# Patient Record
Sex: Male | Born: 1960 | Race: Black or African American | Hispanic: No | Marital: Married | Smoking: Never smoker
Health system: Southern US, Community
[De-identification: ages and names within clinical notes are randomized; demographics above are authoritative.]

## PROBLEM LIST (undated history)

## (undated) DIAGNOSIS — J45909 Unspecified asthma, uncomplicated: Secondary | ICD-10-CM

## (undated) HISTORY — PX: ANKLE SURGERY: SHX546

## (undated) HISTORY — PX: FRACTURE SURGERY: SHX138

---

## 2015-02-20 ENCOUNTER — Encounter (HOSPITAL_COMMUNITY): Payer: Self-pay | Admitting: *Deleted

## 2015-02-20 ENCOUNTER — Emergency Department (HOSPITAL_COMMUNITY): Payer: Self-pay

## 2015-02-20 ENCOUNTER — Emergency Department (HOSPITAL_COMMUNITY)
Admission: EM | Admit: 2015-02-20 | Discharge: 2015-02-20 | Disposition: A | Discharge status: Home / Self Care | Payer: Self-pay | Attending: Emergency Medicine | Admitting: Emergency Medicine

## 2015-02-20 DIAGNOSIS — Z21 Asymptomatic human immunodeficiency virus [HIV] infection status: Secondary | ICD-10-CM | POA: Insufficient documentation

## 2015-02-20 DIAGNOSIS — R0602 Shortness of breath: Secondary | ICD-10-CM

## 2015-02-20 DIAGNOSIS — B349 Viral infection, unspecified: Secondary | ICD-10-CM | POA: Insufficient documentation

## 2015-02-20 DIAGNOSIS — J45909 Unspecified asthma, uncomplicated: Secondary | ICD-10-CM | POA: Insufficient documentation

## 2015-02-20 DIAGNOSIS — Z79899 Other long term (current) drug therapy: Secondary | ICD-10-CM | POA: Insufficient documentation

## 2015-02-20 HISTORY — DX: Unspecified asthma, uncomplicated: J45.909

## 2015-02-20 LAB — CBC WITH DIFFERENTIAL/PLATELET
Basophils Absolute: 0 10*3/uL (ref 0.0–0.1)
Basophils Relative: 0 % (ref 0–1)
Eosinophils Absolute: 0.1 10*3/uL (ref 0.0–0.7)
Eosinophils Relative: 3 % (ref 0–5)
HEMATOCRIT: 39.1 % (ref 39.0–52.0)
Hemoglobin: 12.8 g/dL — ABNORMAL LOW (ref 13.0–17.0)
LYMPHS ABS: 1.4 10*3/uL (ref 0.7–4.0)
Lymphocytes Relative: 32 % (ref 12–46)
MCH: 24 pg — ABNORMAL LOW (ref 26.0–34.0)
MCHC: 32.7 g/dL (ref 30.0–36.0)
MCV: 73.4 fL — ABNORMAL LOW (ref 78.0–100.0)
MONOS PCT: 9 % (ref 3–12)
Monocytes Absolute: 0.4 10*3/uL (ref 0.1–1.0)
NEUTROS ABS: 2.5 10*3/uL (ref 1.7–7.7)
NEUTROS PCT: 56 % (ref 43–77)
Platelets: 237 10*3/uL (ref 150–400)
RBC: 5.33 MIL/uL (ref 4.22–5.81)
RDW: 14.4 % (ref 11.5–15.5)
WBC: 4.4 10*3/uL (ref 4.0–10.5)

## 2015-02-20 LAB — COMPREHENSIVE METABOLIC PANEL
ALBUMIN: 4.1 g/dL (ref 3.5–5.0)
ALT: 24 U/L (ref 17–63)
ANION GAP: 10 (ref 5–15)
AST: 26 U/L (ref 15–41)
Alkaline Phosphatase: 53 U/L (ref 38–126)
BUN: 14 mg/dL (ref 6–20)
CO2: 26 mmol/L (ref 22–32)
CREATININE: 1.27 mg/dL — AB (ref 0.61–1.24)
Calcium: 9.5 mg/dL (ref 8.9–10.3)
Chloride: 100 mmol/L — ABNORMAL LOW (ref 101–111)
GFR calc Af Amer: >60 mL/min (ref >60)
GFR calc non Af Amer: >60 mL/min (ref >60)
GLUCOSE: 102 mg/dL — AB (ref 70–99)
Potassium: 4.3 mmol/L (ref 3.5–5.1)
SODIUM: 136 mmol/L (ref 135–145)
TOTAL PROTEIN: 7.6 g/dL (ref 6.5–8.1)
Total Bilirubin: 3 mg/dL — ABNORMAL HIGH (ref 0.3–1.2)

## 2015-02-20 LAB — URINALYSIS, ROUTINE W REFLEX MICROSCOPIC
Bilirubin Urine: NEGATIVE
GLUCOSE, UA: NEGATIVE mg/dL
HGB URINE DIPSTICK: NEGATIVE
Ketones, ur: NEGATIVE mg/dL
Leukocytes, UA: NEGATIVE
NITRITE: NEGATIVE
PH: 5.5 (ref 5.0–8.0)
Protein, ur: NEGATIVE mg/dL
Specific Gravity, Urine: 1.024 (ref 1.005–1.030)
Urobilinogen, UA: 0.2 mg/dL (ref 0.0–1.0)

## 2015-02-20 MED ORDER — OSELTAMIVIR PHOSPHATE 75 MG PO CAPS: 75.0000 mg | ORAL_CAPSULE | Freq: Two times a day (BID) | ORAL | Status: AC

## 2015-02-20 NOTE — ED Notes (Signed)
Pt is in stable condition upon d/c and is escorted from ED by staff. 

## 2015-02-20 NOTE — ED Notes (Signed)
Pt c/o sinus pressure, chills, body aches, headaches, blurred vision.

## 2015-02-20 NOTE — ED Provider Notes (Signed)
CSN: [id number]     Arrival date & time 02/20/15  4098 History   First MD Initiated Contact with Patient 02/20/15 1106     Chief Complaint  Patient presents with  . Shortness of Breath     (Consider location/radiation/quality/duration/timing/severity/associated sxs/prior Treatment) Patient is a 54 y.o. male presenting with general illness.  Illness Location:  Facial sinus Quality:  Pressure, headache Severity:  Moderate Onset quality:  Gradual Duration:  2 days Timing:  Constant Progression:  Unchanged Chronicity:  New Context:  HIV, compliant, last viral load undetectable Relieved by:  Nothing Worsened by:  Pressure, nose blowing Associated symptoms: myalgias and rhinorrhea   Associated symptoms: no abdominal pain, no cough, no fever, no nausea and no vomiting     Past Medical History  Diagnosis Date  . Asthma    Past Surgical History  Procedure Laterality Date  . Ankle surgery    . Fracture surgery      L femur repair   No family history on file. History  Substance Use Topics  . Smoking status: Never Smoker   . Smokeless tobacco: Not on file  . Alcohol Use: Yes     Comment: occ    Review of Systems  Constitutional: Negative for fever.  HENT: Positive for rhinorrhea.   Respiratory: Negative for cough.   Gastrointestinal: Negative for nausea, vomiting and abdominal pain.  Musculoskeletal: Positive for myalgias.  All other systems reviewed and are negative.     Allergies  Review of patient's allergies indicates no known allergies.  Home Medications   Prior to Admission medications   Medication Sig Start Date End Date Taking? Authorizing Provider  DOXYCYCLINE PO Take 1 Dose by mouth once.   Yes Historical Provider, MD  NORVIR 100 MG TABS tablet Take 1 tablet by mouth daily. 01/25/15  Yes Historical Provider, MD  oseltamivir (TAMIFLU) 75 MG capsule Take 1 capsule (75 mg total) by mouth every 12 (twelve) hours. 02/20/15   Mirian Mo, MD  REYATAZ 300 MG  capsule Take 300 mg by mouth daily. 01/25/15  Yes Historical Provider, MD  TRUVADA 200-300 MG per tablet Take 1 tablet by mouth daily. 01/25/15  Yes Historical Provider, MD   BP 114/70 mmHg  Pulse 80  Temp(Src) 98.1 F (36.7 C) (Oral)  Resp 19  Ht  (1.778 m)  Wt 247 lb 7 oz (112.237 kg)  BMI 35.50 kg/m2  SpO2 93% Physical Exam  Constitutional: He is oriented to person, place, and time. He appears well-developed and well-nourished.  HENT:  Head: Normocephalic and atraumatic.  Eyes: Conjunctivae and EOM are normal.  Neck: Normal range of motion. Neck supple.  Cardiovascular: Normal rate, regular rhythm and normal heart sounds.   Pulmonary/Chest: Effort normal and breath sounds normal. No respiratory distress.  Abdominal: He exhibits no distension. There is no tenderness. There is no rebound and no guarding.  Musculoskeletal: Normal range of motion.  Neurological: He is alert and oriented to person, place, and time.  Skin: Skin is warm and dry.  Vitals reviewed.   ED Course  Procedures (including critical care time) Labs Review Labs Reviewed  CBC WITH DIFFERENTIAL/PLATELET - Abnormal; Notable for the following:    Hemoglobin 12.8 (*)    MCV 73.4 (*)    MCH 24.0 (*)    All other components within normal limits  COMPREHENSIVE METABOLIC PANEL - Abnormal; Notable for the following:    Chloride 100 (*)    Glucose, Bld 102 (*)    Creatinine, Ser  1.27 (*)    Total Bilirubin 3.0 (*)    All other components within normal limits  URINALYSIS, ROUTINE W REFLEX MICROSCOPIC - Abnormal; Notable for the following:    Color, Urine AMBER (*)    APPearance CLOUDY (*)    All other components within normal limits    Imaging Review Dg Chest 2 View  02/20/2015   CLINICAL DATA:  Four days of shortness of breath with onset of productive cough today, history of asthma, nonsmoker.  EXAM: CHEST  2 VIEW  COMPARISON:  None.  FINDINGS: The lungs are adequately inflated. The interstitial markings are  mildly prominent bilaterally. There is no alveolar infiltrate. There is no pleural effusion. The heart and pulmonary vascularity are normal. The trachea is midline. The bony thorax exhibits no acute abnormality.  IMPRESSION: Acute bronchitic changes.  There is no alveolar pneumonia.   Electronically Signed   By: David  Swazil[place]Jordan M.D.   On: 02/20/2015 11:36     EKG Interpretation   Date/Time:  Monday Feb 20 2015 09:34:40 EDT Ventricular Rate:  93 PR Interval:  184 QRS Duration: 108 QT Interval:  354 QTC Calculation: 440 R Axis:   51 Text Interpretation:  Normal sinus rhythm Normal ECG No old tracing to  compare Confirmed by Mirian MoGentry, Matthew [id number] on 02/20/2015 11:10:22 AM      MDM   Final diagnoses:  Viral syndrome    54 y.o. male with pertinent PMH of HIV, compliant presents with likely viral syndrome.  Pt had sick contact at work with similar symptoms.  He has both signs of a URI and myalgias, making influenza possible.  Wu today unremarkable, ANC normal.  Given prescription for tamiflu, will fu.    I have reviewed all laboratory and imaging studies if ordered as above  1. Viral syndrome   2. SOB (shortness of breath)         Mirian MoMatthew Gentry, MD 02/21/15 0800

## 2015-02-20 NOTE — Discharge Instructions (Signed)

## 2015-11-20 IMAGING — CR DG CHEST 2V
2 series · 2 of 2 positions shown · non-contrast
Comparison: None.

CLINICAL DATA: Four days of shortness of breath with onset of
productive cough today, history of asthma, nonsmoker.

EXAM:
CHEST  2 VIEW

[chest pa]
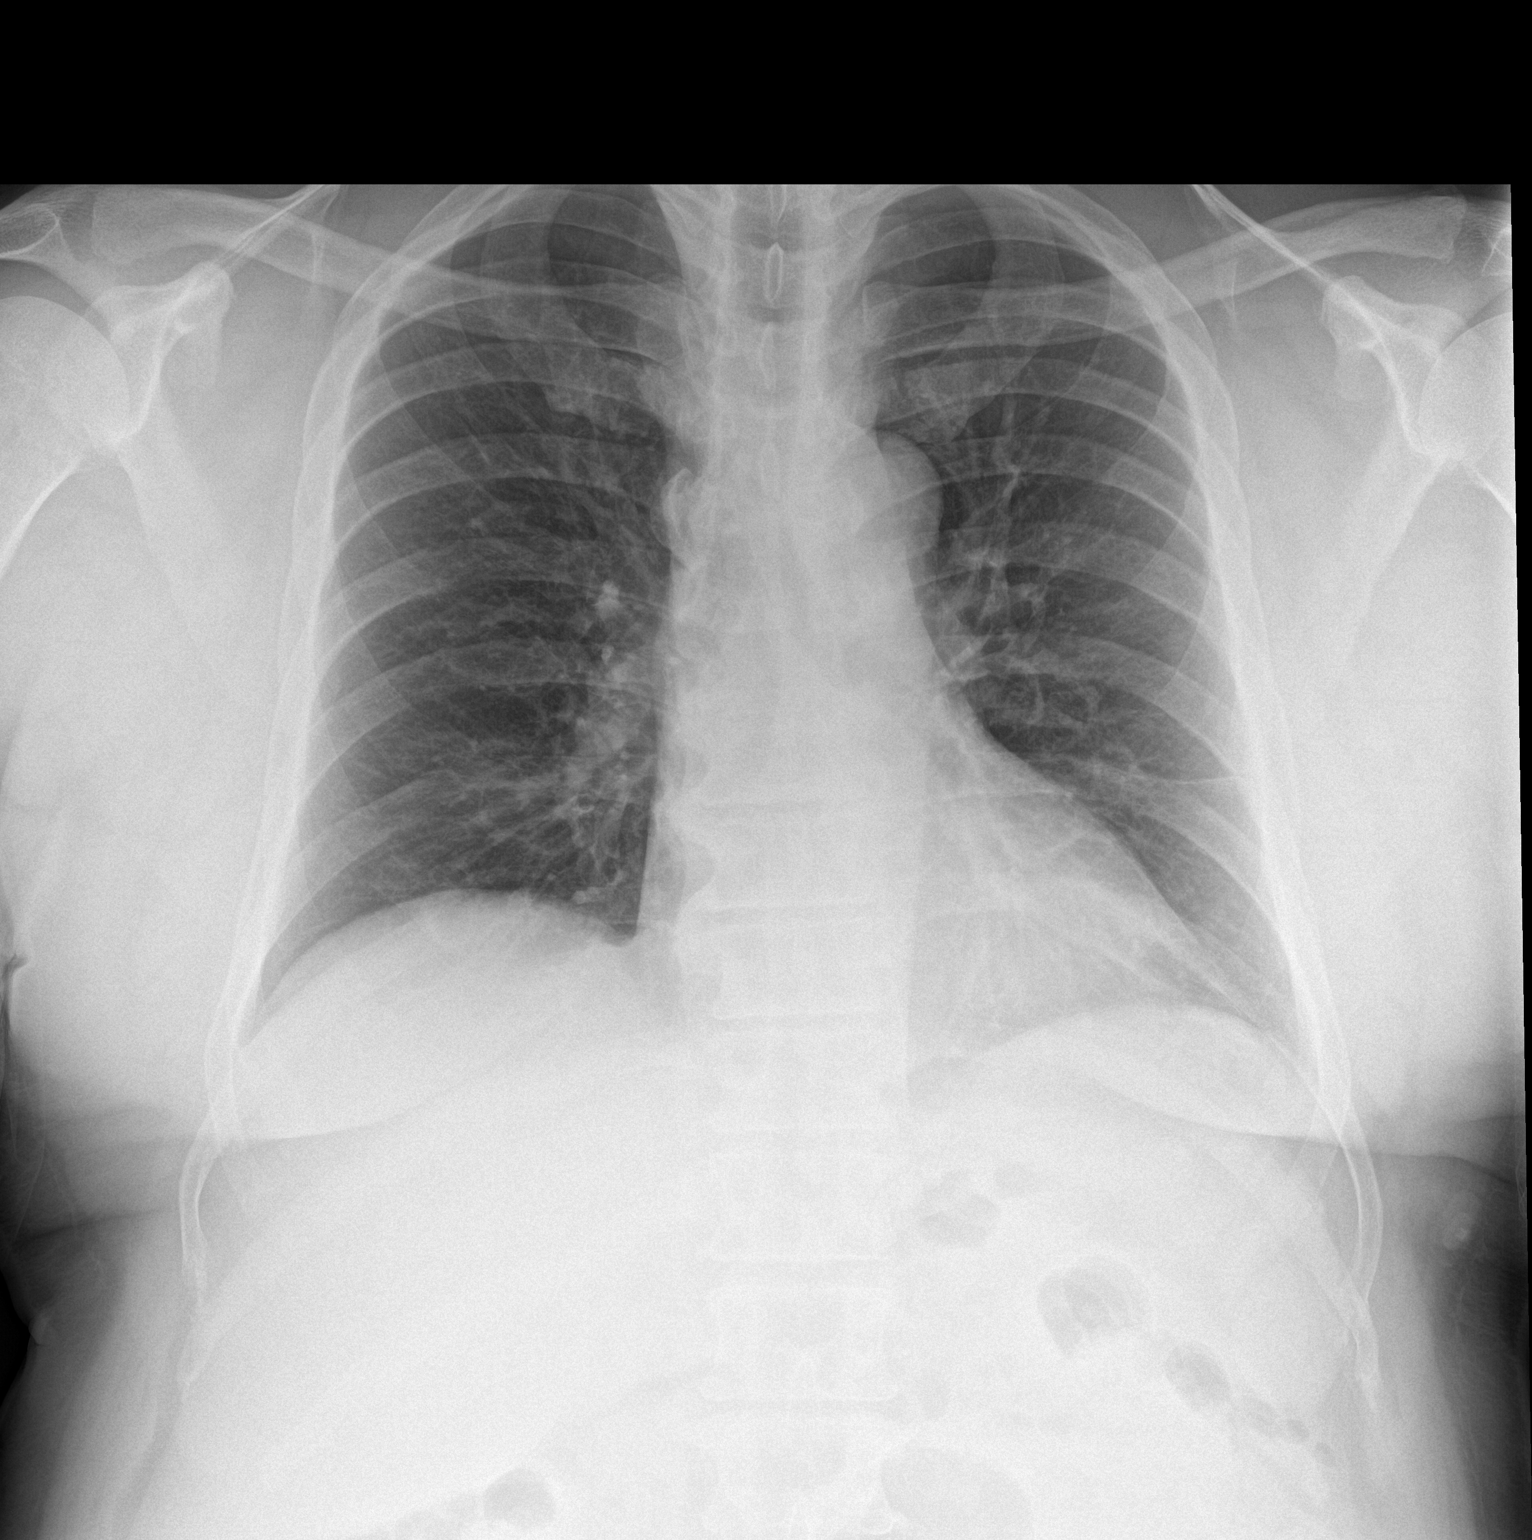

[chest lat]
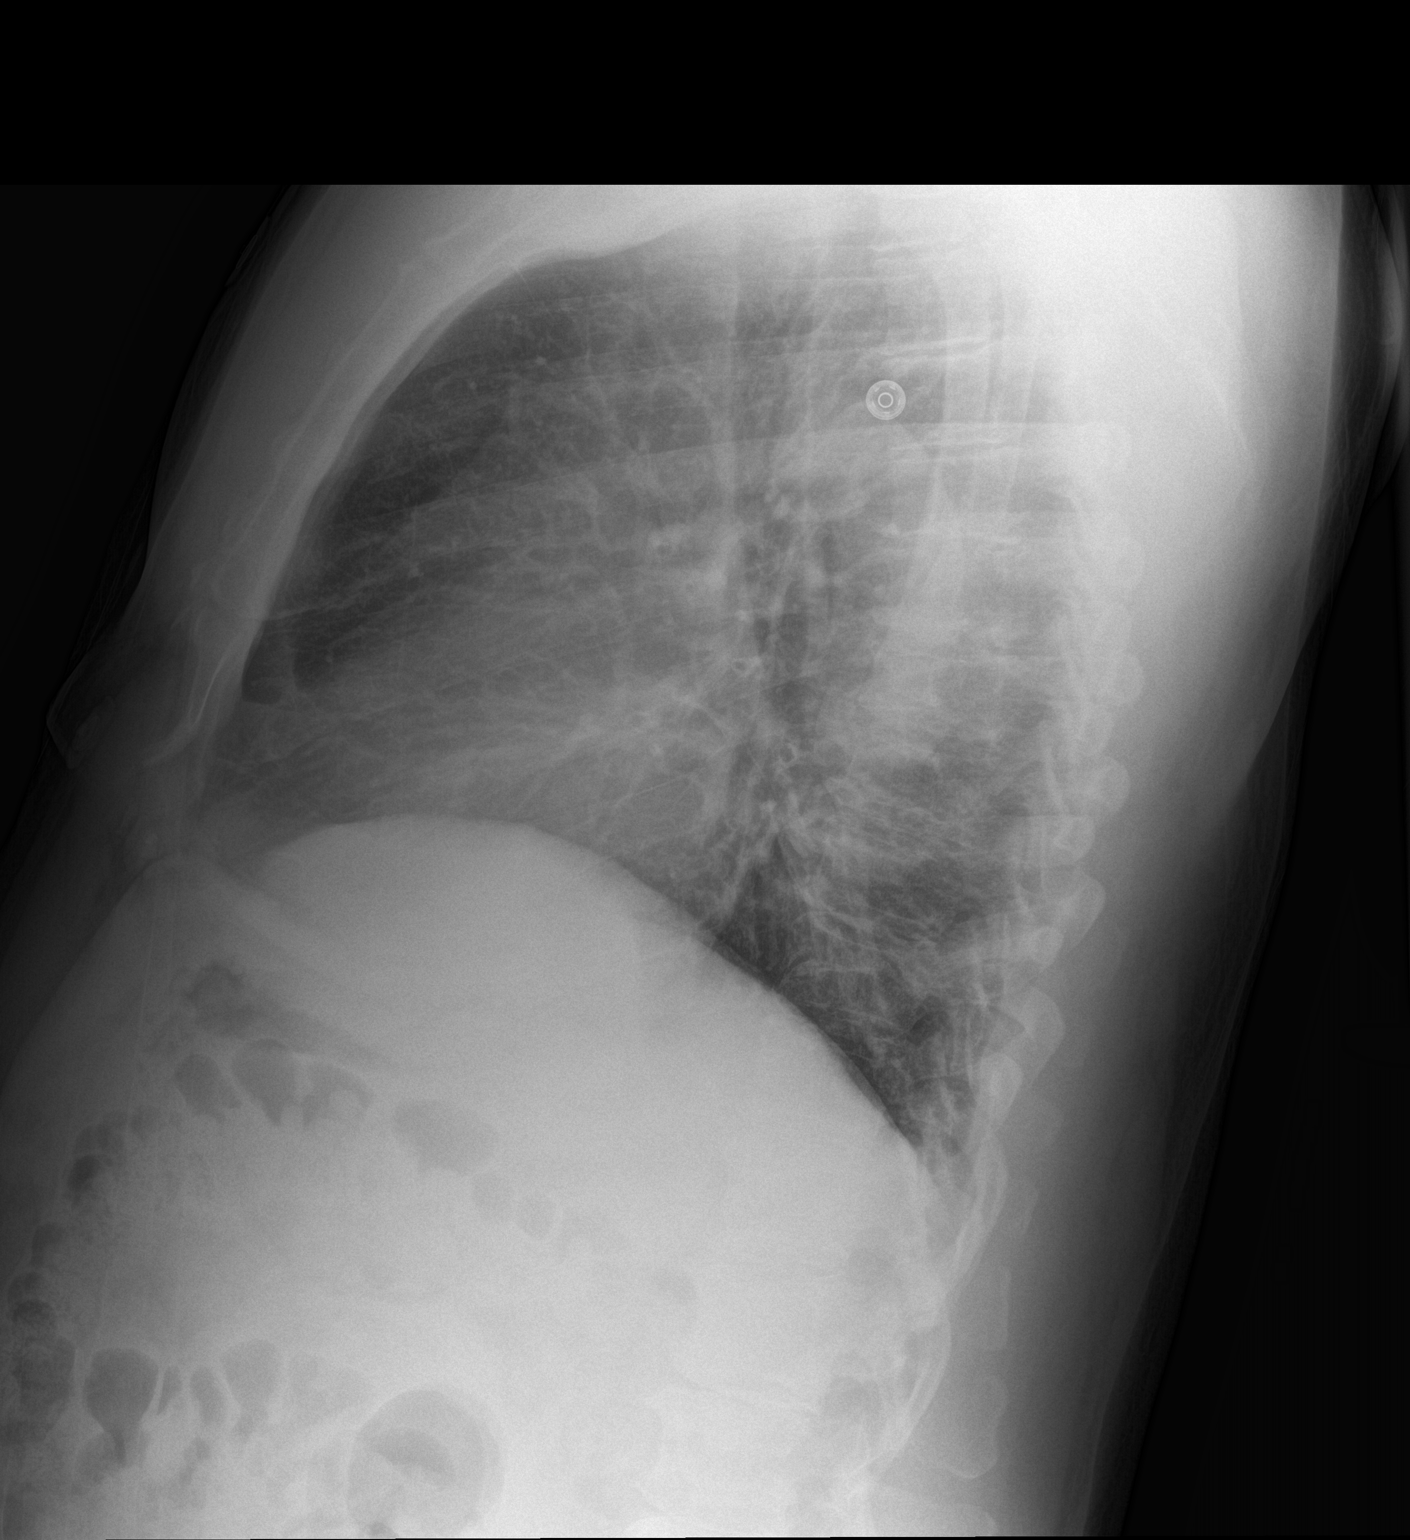

[2 of 2 positions shown; findings below may reference images not displayed]

FINDINGS: The lungs are adequately inflated. The interstitial markings are
mildly prominent bilaterally. There is no alveolar infiltrate. There
is no pleural effusion. The heart and pulmonary vascularity are
normal. The trachea is midline. The bony thorax exhibits no acute
abnormality.
IMPRESSION: Acute bronchitic changes.  There is no alveolar pneumonia.
